# Patient Record
Sex: Male | Born: 1940 | Race: White | Hispanic: No | Marital: Married | Smoking: Never smoker
Health system: Southern US, Community
[De-identification: ages and names within clinical notes are randomized; demographics above are authoritative.]

## PROBLEM LIST (undated history)

## (undated) DIAGNOSIS — I2510 Atherosclerotic heart disease of native coronary artery without angina pectoris: Secondary | ICD-10-CM

## (undated) DIAGNOSIS — I252 Old myocardial infarction: Secondary | ICD-10-CM

## (undated) HISTORY — PX: HERNIA REPAIR: SHX51

---

## 2004-05-07 ENCOUNTER — Ambulatory Visit: Payer: Self-pay | Admitting: Internal Medicine

## 2004-08-20 ENCOUNTER — Ambulatory Visit: Payer: Self-pay | Admitting: Gastroenterology

## 2004-11-10 ENCOUNTER — Ambulatory Visit: Payer: Self-pay | Admitting: Internal Medicine

## 2006-08-26 ENCOUNTER — Ambulatory Visit: Payer: Self-pay | Admitting: Nurse Practitioner

## 2007-08-03 HISTORY — PX: ABDOMINAL AORTIC ANEURYSM REPAIR: SUR1152

## 2007-11-25 ENCOUNTER — Other Ambulatory Visit: Payer: Self-pay

## 2007-11-25 ENCOUNTER — Emergency Department: Payer: Self-pay | Admitting: Emergency Medicine

## 2008-07-02 DIAGNOSIS — I252 Old myocardial infarction: Secondary | ICD-10-CM

## 2008-07-02 HISTORY — DX: Old myocardial infarction: I25.2

## 2008-07-02 HISTORY — PX: CORONARY ANGIOPLASTY WITH STENT PLACEMENT: SHX49

## 2008-07-08 ENCOUNTER — Inpatient Hospital Stay: Payer: Self-pay | Admitting: Internal Medicine

## 2009-11-08 ENCOUNTER — Ambulatory Visit: Payer: Self-pay | Admitting: Family Medicine

## 2009-11-10 ENCOUNTER — Ambulatory Visit: Payer: Self-pay | Admitting: Family Medicine

## 2010-02-27 ENCOUNTER — Ambulatory Visit: Payer: Self-pay | Admitting: Gastroenterology

## 2010-03-03 LAB — PATHOLOGY REPORT

## 2010-06-18 ENCOUNTER — Ambulatory Visit: Payer: Self-pay | Admitting: Gastroenterology

## 2010-07-07 ENCOUNTER — Other Ambulatory Visit: Payer: Self-pay | Admitting: Gastroenterology

## 2017-05-17 ENCOUNTER — Ambulatory Visit
Admission: EM | Admit: 2017-05-17 | Discharge: 2017-05-17 | Disposition: A | Discharge status: Home / Self Care | Payer: Medicare Other | Attending: Family Medicine | Admitting: Family Medicine

## 2017-05-17 DIAGNOSIS — S39012A Strain of muscle, fascia and tendon of lower back, initial encounter: Secondary | ICD-10-CM | POA: Diagnosis not present

## 2017-05-17 HISTORY — DX: Old myocardial infarction: I25.2

## 2017-05-17 MED ORDER — METAXALONE 800 MG PO TABS: 800.0000 mg | ORAL_TABLET | Freq: Three times a day (TID) | ORAL | 0 refills | Status: AC

## 2017-05-17 MED ORDER — MELOXICAM 15 MG PO TABS: 15.0000 mg | ORAL_TABLET | Freq: Every day | ORAL | 0 refills | Status: AC

## 2017-05-17 MED ORDER — KETOROLAC TROMETHAMINE 60 MG/2ML IM SOLN
30.0000 mg | Freq: Once | INTRAMUSCULAR | Status: AC
  Administered 2017-05-17: 30 mg via INTRAMUSCULAR

## 2017-05-17 NOTE — ED Provider Notes (Signed)
[facility]    CSN: [id number] Arrival date & time: 05/17/17  1132     History   Chief Complaint Chief Complaint  Patient presents with  . Back Pain    HPI Geoffrey Harris is a 76 y.o. male.   HPI  This a 76 year old male who is accompanied by family members saying that he's had low back pain radiation over the anterior thigh and into the lower leg over the anteromedial aspect. It does not involve his foot. He has a history of low back pain and has been under the care of a chiropractor. Chiropractor told him he thought it was a hernia because of the distribution of his pain. He has not been taking any medication except over-the-counter NSAIDs which have not been helping. He is very active according to his family climbs ladders works on scaffolding does not remember any specific incident that may have contributed to the onset of his pain.         Past Medical History:  Diagnosis Date  . History of myocardial infarction 07/2008    There are no active problems to display for this patient.   Past Surgical History:  Procedure Laterality Date  . ABDOMINAL AORTIC ANEURYSM REPAIR  08/2007  . CORONARY ANGIOPLASTY WITH STENT PLACEMENT  07/2008  . HERNIA REPAIR      x 2       Home Medications    Prior to Admission medications   Medication Sig Start Date End Date Taking? Authorizing Provider  meloxicam (MOBIC) 15 MG tablet Take 1 tablet (15 mg total) by mouth daily. 05/17/17   Lutricia Feil, PA-C  metaxalone (SKELAXIN) 800 MG tablet Take 1 tablet (800 mg total) by mouth 3 (three) times daily. 05/17/17   Lutricia Feil, PA-C    Family History History reviewed. No pertinent family history.  Social History Social History  Substance Use Topics  . Smoking status: Never Smoker  . Smokeless tobacco: Never Used  . Alcohol use Yes     Comment: occasionally     Allergies   Patient has no known allergies.   Review of Systems Review of Systems    Constitutional: Positive for activity change. Negative for appetite change, chills, fatigue and fever.  Musculoskeletal: Positive for back pain.  All other systems reviewed and are negative.    Physical Exam Triage Vital Signs ED Triage Vitals  Enc Vitals Group     BP 05/17/17 1233 (!) 154/80     Pulse Rate 05/17/17 1233 81     Resp 05/17/17 1233 18     Temp 05/17/17 1233 97.7 F (36.5 C)     Temp Source 05/17/17 1233 Oral     SpO2 05/17/17 1233 99 %     Weight 05/17/17 1230 215 lb (97.5 kg)     Height 05/17/17 1230  (1.803 m)     Head Circumference --      Peak Flow --      Pain Score 05/17/17 1230 10     Pain Loc --      Pain Edu? --      Excl. in GC? --    No data found.   Updated Vital Signs BP (!) 154/80 (BP Location: Left Arm)   Pulse 81   Temp 97.7 F (36.5 C) (Oral)   Resp 18   Ht  (1.803 m)   Wt 215 lb (97.5 kg)   SpO2 99%   BMI 29.99 kg/m  Visual Acuity Right Eye Distance:   Left Eye Distance:   Bilateral Distance:    Right Eye Near:   Left Eye Near:    Bilateral Near:     Physical Exam  Constitutional: He is oriented to person, place, and time. He appears well-developed and well-nourished. No distress.  HENT:  Head: Normocephalic.  Eyes: Pupils are equal, round, and reactive to light.  Neck: Normal range of motion.  Musculoskeletal: He exhibits tenderness.  Examination of lumbar spine shows a level pelvis in stance. Patient has a marked flattening of the lordotic curve. Lower segments of the lumbar spine do not move his normally with motion. Able to for flex with his hands to the level of his mid thigh. It assumes the upright position with some difficulty. Bilateral flexion also shows little movement of the lumbosacral spine.she is able to toe and heel walk adequately. EHL peroneal and anterior tibialis muscles are strong to clinical testing. Sensation is intact throughout the lower extremities.Straight leg raise testing in the seated  position is negative at 90 bilaterally.  Examination of the abdomen does not reveal any direct or indirect herniation.  Neurological: He is alert and oriented to person, place, and time.  Skin: Skin is warm and dry. He is not diaphoretic.  Psychiatric: He has a normal mood and affect. His behavior is normal. Judgment and thought content normal.  Nursing note and vitals reviewed.    UC Treatments / Results  Labs (all labs ordered are listed, but only abnormal results are displayed) Labs Reviewed - No data to display  EKG  EKG Interpretation None       Radiology No results found.  Procedures Procedures (including critical care time)  Medications Ordered in UC Medications  ketorolac (TORADOL) injection 30 mg (30 mg Intramuscular Given 05/17/17 1353)     Initial Impression / Assessment and Plan / UC Course  I have reviewed the triage vital signs and the nursing notes.  Pertinent labs & imaging results that were available during my care of the patient were reviewed by me and considered in my medical decision making (see chart for details).     Plan: 1. Test/x-ray results and diagnosis reviewed with patient 2. rx as per orders; risks, benefits, potential side effects reviewed with patient 3. Recommend supportive treatment with rest and symptom avoidance.avoid sitting lifting or bending. If you're not improving in 10-14 days follow-up with your primary care physician. Use ice for 20 minutes out of every 2 hours 4-5 times daily. Caution with Skelaxin. Do not perform activities requiring concentration or judgment or balance. Do not drive while taking the medication. 4. F/u prn if symptoms worsen or don't improve   Final Clinical Impressions(s) / UC Diagnoses   Final diagnoses:  Strain of lumbar region, initial encounter    New Prescriptions Discharge Medication List as of 05/17/2017  2:00 PM    START taking these medications   Details  meloxicam (MOBIC) 15 MG tablet  Take 1 tablet (15 mg total) by mouth daily., Starting Tue 05/17/2017, Normal    metaxalone (SKELAXIN) 800 MG tablet Take 1 tablet (800 mg total) by mouth 3 (three) times daily., Starting Tue 05/17/2017, Normal         Controlled Substance Prescriptions [place] Controlled Substance Registry consulted? Not Applicable   Lutricia Feil, PA-C 05/17/17 1427

## 2017-05-17 NOTE — Discharge Instructions (Signed)
Use ice 20 minutes out of every 2 hours 4-5 times daily. May use Biofreeze 3 times daily as necessary. Use caution while using muscle relaxers. Do not perform activities requiring concentration judgment or balance. Not drivable taking the muscle relaxers.If these abnormal clinical findings persist, appropriate workup will be required. The patient understands that follow up is required to elucidate the situation. Not improving in a week to 10 days  recommend following up with her primary care physician.

## 2017-05-17 NOTE — ED Triage Notes (Signed)
Patient complains of low back pain that radiates down the back of his left leg. Patient states that area is very painful constantly. Patient states that he has seen chiropractor. Patient states that chiropractor thought it was hernia but patient feels like the pain is related to a disc.

## 2017-05-18 ENCOUNTER — Encounter: Payer: Self-pay | Admitting: Emergency Medicine

## 2017-05-18 ENCOUNTER — Emergency Department
Admission: EM | Admit: 2017-05-18 | Discharge: 2017-05-18 | Disposition: A | Discharge status: Home / Self Care | Payer: Medicare Other | Attending: Emergency Medicine | Admitting: Emergency Medicine

## 2017-05-18 ENCOUNTER — Emergency Department: Payer: Medicare Other

## 2017-05-18 DIAGNOSIS — I2510 Atherosclerotic heart disease of native coronary artery without angina pectoris: Secondary | ICD-10-CM | POA: Diagnosis not present

## 2017-05-18 DIAGNOSIS — M4726 Other spondylosis with radiculopathy, lumbar region: Secondary | ICD-10-CM | POA: Insufficient documentation

## 2017-05-18 DIAGNOSIS — M545 Low back pain: Secondary | ICD-10-CM | POA: Diagnosis present

## 2017-05-18 HISTORY — DX: Atherosclerotic heart disease of native coronary artery without angina pectoris: I25.10

## 2017-05-18 MED ORDER — HYDROMORPHONE HCL 1 MG/ML IJ SOLN: 0.5000 mg | Freq: Once | INTRAMUSCULAR | Status: DC

## 2017-05-18 MED ORDER — HYDROMORPHONE HCL 1 MG/ML IJ SOLN
INTRAMUSCULAR | Status: AC
  Administered 2017-05-18: 0.5 mg via INTRAMUSCULAR
  Filled 2017-05-18: qty 1

## 2017-05-18 MED ORDER — HYDROMORPHONE HCL 1 MG/ML IJ SOLN
0.5000 mg | Freq: Once | INTRAMUSCULAR | Status: AC
  Administered 2017-05-18: 0.5 mg via INTRAMUSCULAR

## 2017-05-18 MED ORDER — KETOROLAC TROMETHAMINE 60 MG/2ML IM SOLN
60.0000 mg | Freq: Once | INTRAMUSCULAR | Status: AC
  Administered 2017-05-18: 60 mg via INTRAMUSCULAR
  Filled 2017-05-18: qty 2

## 2017-05-18 MED ORDER — TRAMADOL HCL 50 MG PO TABS: 50.0000 mg | ORAL_TABLET | Freq: Two times a day (BID) | ORAL | 0 refills | Status: AC | PRN

## 2017-05-18 MED ORDER — ORPHENADRINE CITRATE 30 MG/ML IJ SOLN
60.0000 mg | Freq: Two times a day (BID) | INTRAMUSCULAR | Status: DC
  Administered 2017-05-18: 60 mg via INTRAMUSCULAR
  Filled 2017-05-18: qty 2

## 2017-05-18 MED ORDER — METHOCARBAMOL 750 MG PO TABS: 750.0000 mg | ORAL_TABLET | Freq: Four times a day (QID) | ORAL | 0 refills | Status: AC

## 2017-05-18 NOTE — ED Provider Notes (Signed)
[facility] Emergency Department Provider Note   ____________________________________________   None    (approximate)  I have reviewed the triage vital signs and the nursing notes.   HISTORY  Chief Complaint Leg Pain    HPI Geoffrey Harris is a 76 y.o. male patient arrived via EMS from home with complaint of radicular back pain to the left lower extremity. Patient stated onset of complaint for one week. Patient has been seen by a chiropractor multiple times for back pain. Patient state chiropractor believe he has disc problems.Patient denies bladder or bowel dysfunction. Patient was seen by PCP yesterday and received moderate relief with Toradol injection. Patient was given a prescription from meloxicam and Skelaxin.Patient was unable to have the muscle relaxant prescription filled due to not having problems to sensation. No palliative measures taken for a complaint today. Patient rates pain as a 10 over 10. Patient described a pain as "achy".   Past Medical History:  Diagnosis Date  . Coronary artery disease   . History of myocardial infarction 07/2008    There are no active problems to display for this patient.   Past Surgical History:  Procedure Laterality Date  . ABDOMINAL AORTIC ANEURYSM REPAIR  08/2007  . CORONARY ANGIOPLASTY WITH STENT PLACEMENT  07/2008  . HERNIA REPAIR      x 2    Prior to Admission medications   Medication Sig Start Date End Date Taking? Authorizing Provider  meloxicam (MOBIC) 15 MG tablet Take 1 tablet (15 mg total) by mouth daily. 05/17/17   Lutricia Feiloemer, William P, PA-C  metaxalone (SKELAXIN) 800 MG tablet Take 1 tablet (800 mg total) by mouth 3 (three) times daily. 05/17/17   Lutricia Feiloemer, William P, PA-C  methocarbamol (ROBAXIN-750) 750 MG tablet Take 1 tablet (750 mg total) by mouth 4 (four) times daily. 05/18/17   Joni ReiningSmith, Briona Korpela K, PA-C    Allergies Patient has no known allergies.  History reviewed. No pertinent family  history.  Social History Social History  Substance Use Topics  . Smoking status: Never Smoker  . Smokeless tobacco: Never Used  . Alcohol use Yes     Comment: occasionally    Review of Systems  Constitutional: No fever/chills Eyes: No visual changes. ENT: No sore throat. Cardiovascular: Denies chest pain. Respiratory: Denies shortness of breath. Gastrointestinal: No abdominal pain.  No nausea, no vomiting.  No diarrhea.  No constipation. Genitourinary: Negative for dysuria. Musculoskeletal: chronic back pain Skin: Negative for rash. Neurological: Negative for headaches, focal weakness or numbness.   ____________________________________________   PHYSICAL EXAM:  VITAL SIGNS: ED Triage Vitals  Enc Vitals Group     BP 05/18/17 1033 (!) 151/77     Pulse Rate 05/18/17 1033 64     Resp 05/18/17 1033 18     Temp 05/18/17 1033 97.7 F (36.5 C)     Temp Source 05/18/17 1033 Oral     SpO2 05/18/17 1033 96 %     Weight 05/18/17 1034 215 lb (97.5 kg)     Height 05/18/17 1034 5\' 11"  (1.803 m)     Head Circumference --      Peak Flow --      Pain Score 05/18/17 1048 10     Pain Loc --      Pain Edu? --      Excl. in GC? --    Constitutional: Alert and oriented. Well appearing and in no acute distress. Cardiovascular: Normal rate, regular rhythm. Grossly normal heart sounds.  Good peripheral circulation. Respiratory: Normal respiratory effort.  No retractions. Lungs CTAB. Gastrointestinal: Soft and nontender. No distention. No abdominal bruits. No CVA tenderness. Musculoskeletal: over spinal deformity. Patient has moderate guarding palpation L3-S1. Patient has negative straight leg test. Neurologic:  Normal speech and language. No gross focal neurologic deficits are appreciated. No gait instability. Skin:  Skin is warm, dry and intact. No rash noted. Psychiatric: Mood and affect are normal. Speech and behavior are normal.  ____________________________________________     LABS (all labs ordered are listed, but only abnormal results are displayed)  Labs Reviewed - No data to display ____________________________________________  EKG   ____________________________________________  RADIOLOGY  Dg Lumbar Spine Complete  Result Date: 05/18/2017 CLINICAL DATA:  Acute low back pain without known injury. EXAM: LUMBAR SPINE - COMPLETE 4+ VIEW COMPARISON:  None. FINDINGS: No fracture or spondylolisthesis is noted. Mild degenerative disc disease is noted at L4-5 with anterior osteophyte formation. Atherosclerosis of abdominal aorta is noted. Posterior facet joints are unremarkable. IMPRESSION: Aortic atherosclerosis. Mild degenerative disc disease is noted at L4-5. No acute abnormality seen the lumbar spine. Electronically Signed   By: Lupita Raider, M.D.   On: 05/18/2017 12:28    ____________________________________________   PROCEDURES  Procedure(s) performed: None  Procedures  Critical Care performed: No  ____________________________________________   INITIAL IMPRESSION / ASSESSMENT AND PLAN / ED COURSE  As part of my medical decision making, I reviewed the following data within the electronic MEDICAL RECORD NUMBER    Radicular low back pain secondary to arthritis .discussed x-ray finding with patient. Patient given discharge care instructions. Patient advised to continue previous medication given a prescription for Robaxin.    ____________________________________________   FINAL CLINICAL IMPRESSION(S) / ED DIAGNOSES  Final diagnoses:  Osteoarthritis of spine with radiculopathy, lumbar region      NEW MEDICATIONS STARTED DURING THIS VISIT:  New Prescriptions   METHOCARBAMOL (ROBAXIN-750) 750 MG TABLET    Take 1 tablet (750 mg total) by mouth 4 (four) times daily.     Note:  This document was prepared using Dragon voice recognition software and may include unintentional dictation errors.    Joni Reining, PA-C 05/18/17 1300     Rockne Menghini, MD 05/18/17 [phone]

## 2017-05-18 NOTE — ED Triage Notes (Addendum)
Pt arrived via EMS from home with reports of left leg pain for the past week. Pt has been seen by Chiropractor multiple times for back issues due to disc problem.  Pt is ambulatory but states it is painful to bear weight.  Pt states pain from his back radiates down the front of his leg.  Pt sitting in wheelchair states the pain is constant. Pt denies any falls or injury to left leg.

## 2017-05-18 NOTE — Discharge Instructions (Signed)
Continue meloxicam and start Robaxin as directed.

## 2017-06-03 ENCOUNTER — Other Ambulatory Visit: Payer: Self-pay | Admitting: Family Medicine

## 2017-06-03 DIAGNOSIS — R1032 Left lower quadrant pain: Secondary | ICD-10-CM

## 2017-06-06 ENCOUNTER — Ambulatory Visit
Admission: RE | Admit: 2017-06-06 | Discharge: 2017-06-06 | Disposition: A | Discharge status: Home / Self Care | Payer: Medicare Other | Source: Ambulatory Visit | Attending: Family Medicine | Admitting: Family Medicine

## 2017-06-06 DIAGNOSIS — N503 Cyst of epididymis: Secondary | ICD-10-CM | POA: Insufficient documentation

## 2017-06-06 DIAGNOSIS — N442 Benign cyst of testis: Secondary | ICD-10-CM | POA: Insufficient documentation

## 2017-06-06 DIAGNOSIS — R1032 Left lower quadrant pain: Secondary | ICD-10-CM

## 2017-06-06 DIAGNOSIS — R1030 Lower abdominal pain, unspecified: Secondary | ICD-10-CM | POA: Diagnosis present

## 2017-12-31 DEATH — deceased

## 2019-02-19 IMAGING — US US SCROTUM W/ DOPPLER COMPLETE
1 series · 13 of 25 positions shown · non-contrast
Comparison: None.

CLINICAL DATA: Left groin pain for 3 weeks.

EXAM:
SCROTAL ULTRASOUND
DOPPLER ULTRASOUND OF THE TESTICLES
TECHNIQUE: Complete ultrasound examination of the testicles, epididymis, and
other scrotal structures was performed. Color and spectral Doppler
ultrasound were also utilized to evaluate blood flow to the
testicles.

[Series 1: us scrotum w/ doppler complete · 0.08mm/px · 13 of 113 slices shown]
[im 1/113]
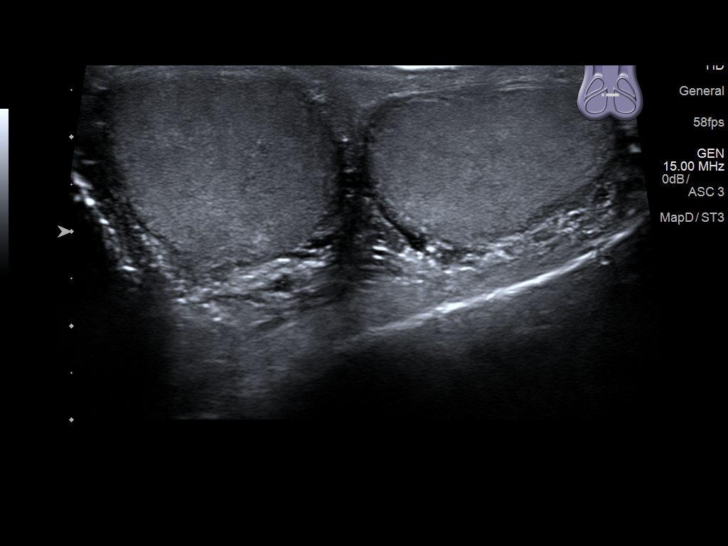
[im 10/113]
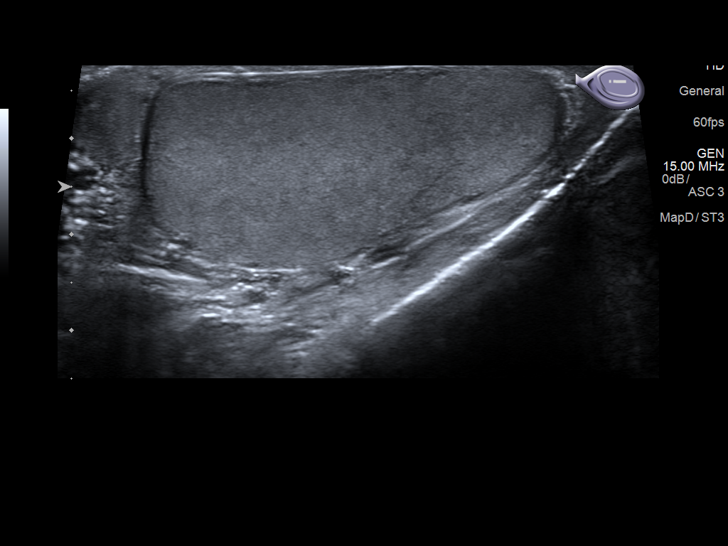
[im 19/113]
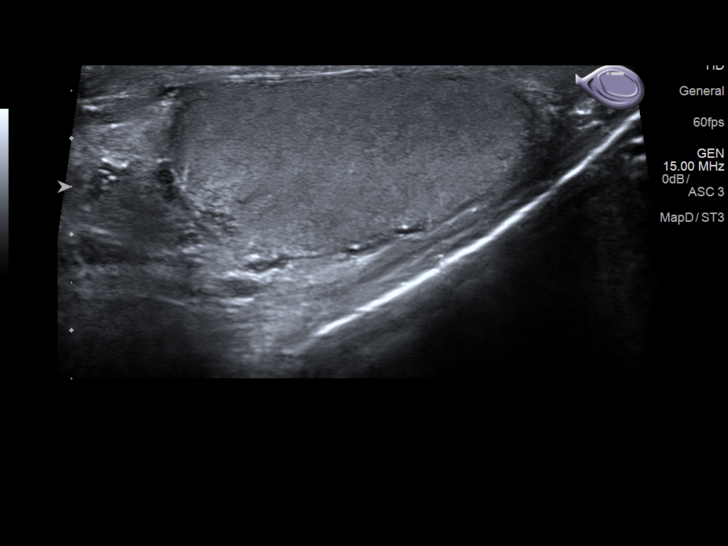
[im 29/113]
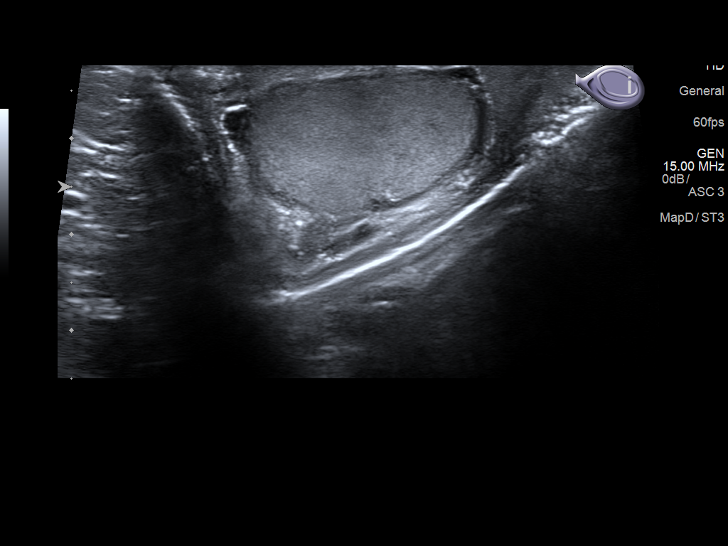
[im 38/113]
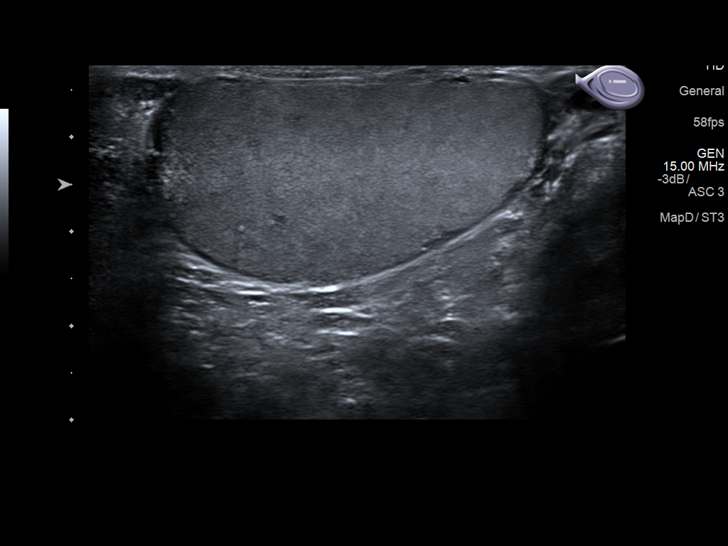
[im 47/113]
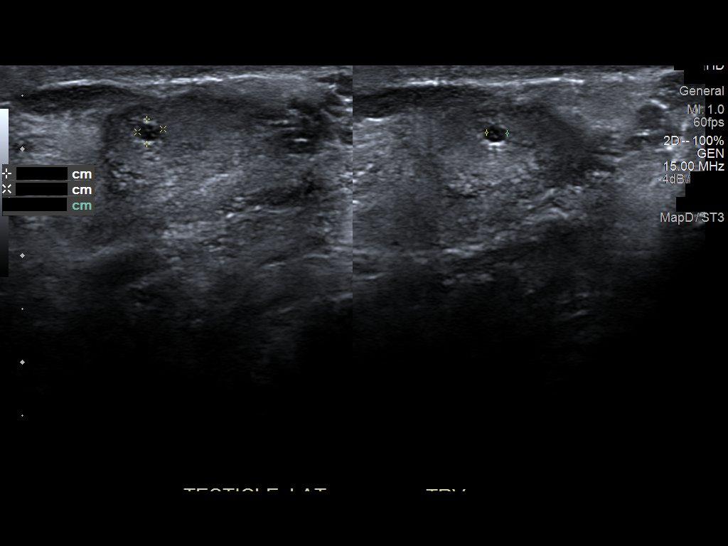
[im 57/113]
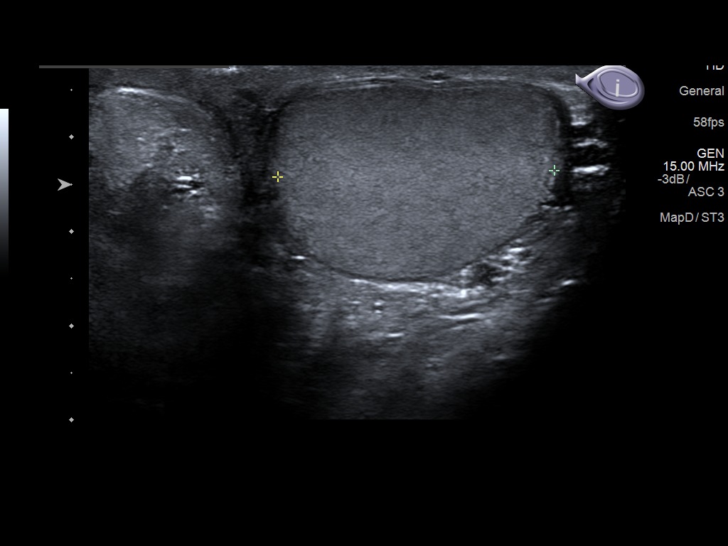
[im 66/113]
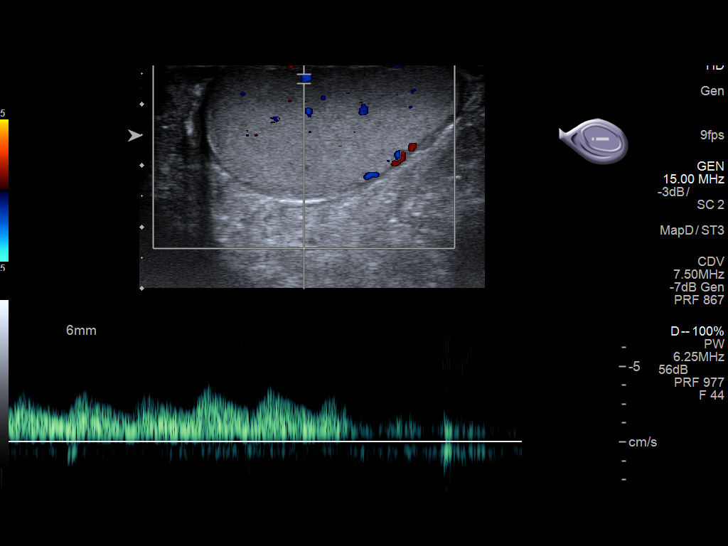
[im 75/113]
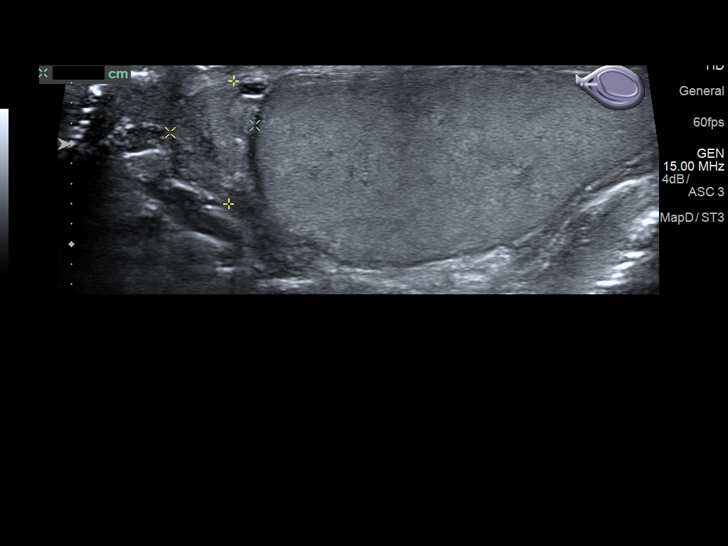
[im 85/113]
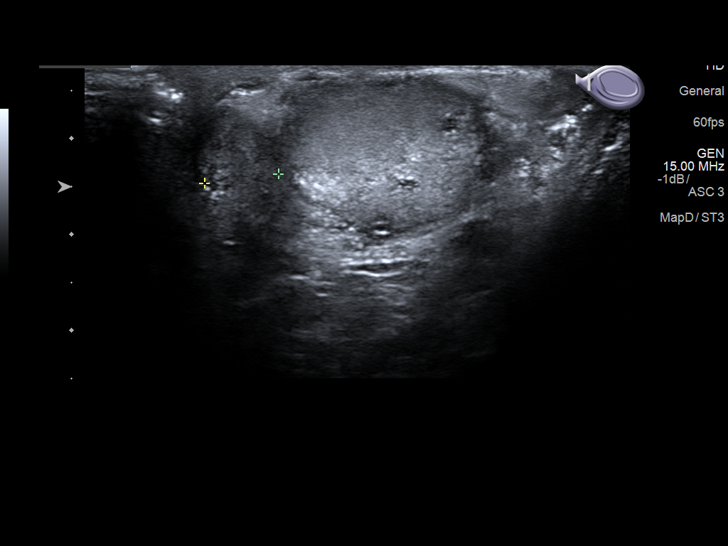
[im 94/113]
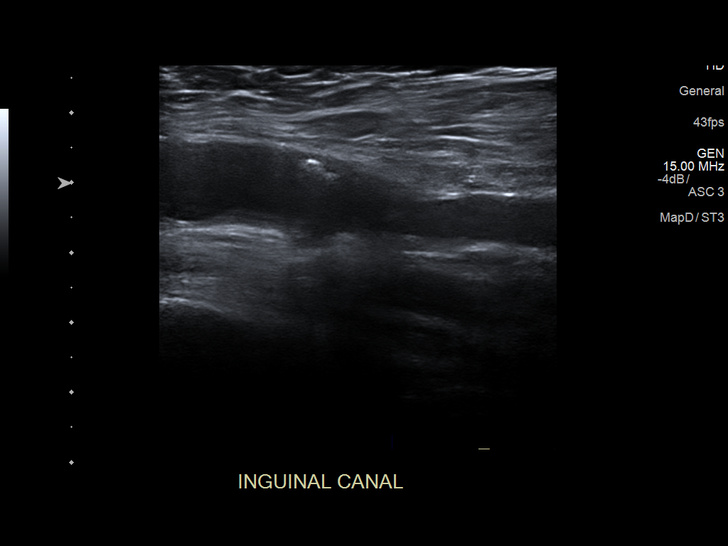
[im 103/113]
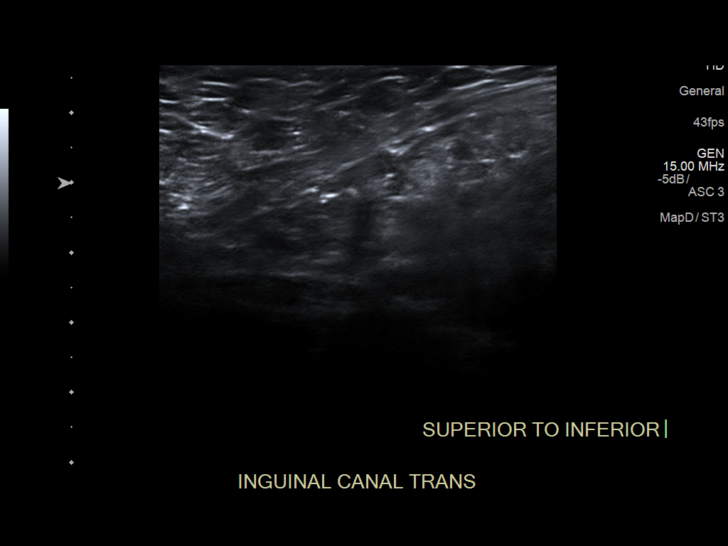
[im 113/113]
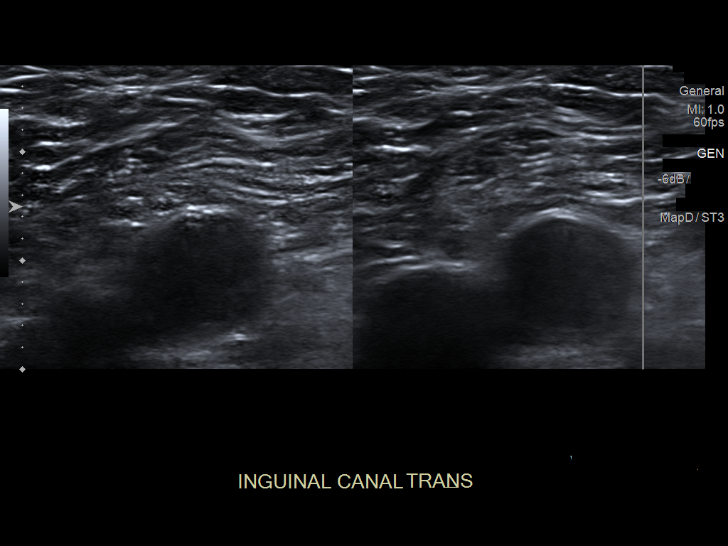

[13 of 25 positions shown; findings below may reference images not displayed]

FINDINGS: Right testicle

Measurements: 4.4 x 2.0 x 2.9 cm. No mass or microlithiasis
visualized.

Left testicle

Measurements: 4.3 x 2.2 x 2.9 cm. Two tiny simple cysts are present
in the lateral superior left testis, largest 0.2 x 0.3 x 0.2 cm,
which appear to be located at the margin of the mediastinum testis
and likely represent tiny spermatoceles. No suspicious left
testicular masses.

Right epididymis: Tiny 0.2 x 0.3 x 0.3 cm simple cyst versus
spermatocele in the right epididymal head. Otherwise normal right
epididymis.

Left epididymis:  Normal in size and appearance.

Hydrocele:  None visualized.

Varicocele:  None visualized.

Pulsed Doppler interrogation of both testes demonstrates normal low
resistance arterial and venous waveforms bilaterally.

Representative images of the left inguinal region demonstrate no
lymphadenopathy, abnormal mass or fluid collection. A hernia is not
demonstrated in the left inguinal region.
IMPRESSION: 1. No evidence of testicular torsion.  No hydroceles.
2. Two tiny simple 2-3 mm cysts in the superolateral left testis at
the margin of the mediastinum testis, likely tiny spermatoceles. No
suspicious testicular masses.
3. Tiny 3 mm simple cyst versus spermatocele in the right epididymal
head. Otherwise normal epididymides.
4. No abnormal sonographic findings in the left inguinal region.
